# Patient Record
Sex: Male | Born: 1970 | Race: White | Hispanic: No | Marital: Married | Smoking: Never smoker
Health system: Southern US, Community
[De-identification: ages and names within clinical notes are randomized; demographics above are authoritative.]

## PROBLEM LIST (undated history)

## (undated) DIAGNOSIS — K219 Gastro-esophageal reflux disease without esophagitis: Secondary | ICD-10-CM

---

## 1998-07-31 ENCOUNTER — Emergency Department (HOSPITAL_COMMUNITY): Admission: EM | Admit: 1998-07-31 | Discharge: 1998-08-01 | Payer: Self-pay | Admitting: Emergency Medicine

## 2008-12-18 ENCOUNTER — Ambulatory Visit: Payer: Self-pay | Admitting: Sports Medicine

## 2008-12-18 DIAGNOSIS — M629 Disorder of muscle, unspecified: Secondary | ICD-10-CM

## 2008-12-18 DIAGNOSIS — M2140 Flat foot [pes planus] (acquired), unspecified foot: Secondary | ICD-10-CM | POA: Insufficient documentation

## 2008-12-26 ENCOUNTER — Encounter: Payer: Self-pay | Admitting: Sports Medicine

## 2013-12-08 ENCOUNTER — Emergency Department (HOSPITAL_BASED_OUTPATIENT_CLINIC_OR_DEPARTMENT_OTHER)
Admission: EM | Admit: 2013-12-08 | Discharge: 2013-12-08 | Disposition: A | Discharge status: Home / Self Care | Payer: BC Managed Care – PPO | Attending: Emergency Medicine | Admitting: Emergency Medicine

## 2013-12-08 ENCOUNTER — Emergency Department (HOSPITAL_BASED_OUTPATIENT_CLINIC_OR_DEPARTMENT_OTHER): Payer: BC Managed Care – PPO

## 2013-12-08 ENCOUNTER — Encounter (HOSPITAL_BASED_OUTPATIENT_CLINIC_OR_DEPARTMENT_OTHER): Payer: Self-pay | Admitting: Emergency Medicine

## 2013-12-08 DIAGNOSIS — N200 Calculus of kidney: Secondary | ICD-10-CM

## 2013-12-08 DIAGNOSIS — R109 Unspecified abdominal pain: Secondary | ICD-10-CM

## 2013-12-08 LAB — BASIC METABOLIC PANEL
BUN: 9 mg/dL (ref 6–23)
CO2: 24 mEq/L (ref 19–32)
Calcium: 9.7 mg/dL (ref 8.4–10.5)
GFR calc non Af Amer: >90 mL/min (ref >90)
Glucose, Bld: 116 mg/dL — ABNORMAL HIGH (ref 70–99)
Sodium: 139 mEq/L (ref 135–145)

## 2013-12-08 LAB — URINE MICROSCOPIC-ADD ON

## 2013-12-08 LAB — URINALYSIS, ROUTINE W REFLEX MICROSCOPIC
Glucose, UA: NEGATIVE mg/dL
Ketones, ur: 15 mg/dL — AB
Nitrite: NEGATIVE
Protein, ur: 30 mg/dL — AB
Specific Gravity, Urine: 1.027 (ref 1.005–1.030)

## 2013-12-08 MED ORDER — HYDROCODONE-ACETAMINOPHEN 5-325 MG PO TABS: 2.0000 | ORAL_TABLET | ORAL | Status: DC | PRN

## 2013-12-08 MED ORDER — ONDANSETRON HCL 4 MG/2ML IJ SOLN
4.0000 mg | Freq: Once | INTRAMUSCULAR | Status: AC
  Administered 2013-12-08: 4 mg via INTRAVENOUS
  Filled 2013-12-08: qty 2

## 2013-12-08 MED ORDER — KETOROLAC TROMETHAMINE 30 MG/ML IJ SOLN
30.0000 mg | Freq: Once | INTRAMUSCULAR | Status: AC
  Administered 2013-12-08: 30 mg via INTRAVENOUS
  Filled 2013-12-08: qty 1

## 2013-12-08 MED ORDER — SODIUM CHLORIDE 0.9 % IV BOLUS (SEPSIS)
500.0000 mL | Freq: Once | INTRAVENOUS | Status: AC
  Administered 2013-12-08: 500 mL via INTRAVENOUS

## 2013-12-08 MED ORDER — TAMSULOSIN HCL 0.4 MG PO CAPS: 0.4000 mg | ORAL_CAPSULE | Freq: Every day | ORAL | Status: DC

## 2013-12-08 MED ORDER — CIPROFLOXACIN HCL 500 MG PO TABS: 500.0000 mg | ORAL_TABLET | Freq: Two times a day (BID) | ORAL | Status: DC

## 2013-12-08 MED ORDER — CEPHALEXIN 500 MG PO CAPS: 500.0000 mg | ORAL_CAPSULE | Freq: Two times a day (BID) | ORAL | Status: DC

## 2013-12-08 MED ORDER — HYDROMORPHONE HCL PF 1 MG/ML IJ SOLN: 0.5000 mg | Freq: Once | INTRAMUSCULAR | Status: DC | PRN

## 2013-12-08 MED ORDER — HYDROMORPHONE HCL PF 1 MG/ML IJ SOLN
0.5000 mg | Freq: Once | INTRAMUSCULAR | Status: AC
  Administered 2013-12-08: 0.5 mg via INTRAVENOUS
  Filled 2013-12-08: qty 1

## 2013-12-08 NOTE — ED Notes (Signed)
Pt did not require any additional pain medication

## 2013-12-08 NOTE — ED Provider Notes (Signed)
CSN: [id number]     Arrival date & time 12/08/13  0732 History   First MD Initiated Contact with Patient 12/08/13 [phone]     Chief Complaint  Patient presents with  . Flank Pain   (Consider location/radiation/quality/duration/timing/severity/associated sxs/prior Treatment) HPI Comments: 42 yo male with no medical hx, non smoker presents with intermittent sharp/ ache left flank pain with radiation to groin since Monday, no kidney stone hx but FH of stones, no abdo surgeries, no documented fevers.  Mild nausea/ vomiting once.  No diverticular hx.  Nothing improves. No meds today. PNC allergy.  Patient is a 42 y.o. male presenting with flank pain. The history is provided by the patient.  Flank Pain This is a new problem. Pertinent negatives include no chest pain, no abdominal pain, no headaches and no shortness of breath.    History reviewed. No pertinent past medical history. History reviewed. No pertinent past surgical history. History reviewed. No pertinent family history. History  Substance Use Topics  . Smoking status: Never Smoker   . Smokeless tobacco: Not on file  . Alcohol Use: Yes     Comment: occ    Review of Systems  Constitutional: Negative for fever and chills.  HENT: Negative for congestion.   Eyes: Negative for visual disturbance.  Respiratory: Negative for shortness of breath.   Cardiovascular: Negative for chest pain.  Gastrointestinal: Negative for vomiting and abdominal pain.  Genitourinary: Positive for dysuria and flank pain.  Musculoskeletal: Positive for back pain. Negative for neck pain and neck stiffness.  Skin: Negative for rash.  Neurological: Negative for light-headedness and headaches.    Allergies  Review of patient's allergies indicates no known allergies.  Home Medications  No current outpatient prescriptions on file. BP 157/94  Pulse 74  Temp(Src) 97.8 F (36.6 C) (Oral)  Resp 20  SpO2 100% Physical Exam  Nursing note and vitals  reviewed. Constitutional: He is oriented to person, place, and time. He appears well-developed and well-nourished.  HENT:  Head: Normocephalic and atraumatic.  Eyes: Conjunctivae are normal. Right eye exhibits no discharge. Left eye exhibits no discharge.  Neck: Normal range of motion. Neck supple. No tracheal deviation present.  Cardiovascular: Normal rate and regular rhythm.   Pulmonary/Chest: Effort normal and breath sounds normal.  Abdominal: Soft. He exhibits no distension. There is tenderness (mild left lower flank). There is no guarding.  Musculoskeletal: He exhibits no edema.  Neurological: He is alert and oriented to person, place, and time.  Skin: Skin is warm. No rash noted.  Psychiatric: He has a normal mood and affect.    ED Course  Procedures (including critical care time) Emergency Focused Ultrasound Exam Limited retroperitoneal ultrasound of kidneys  Performed and interpreted by Dr. Jodi Mourning Indication: flank pain Focused abdominal ultrasound with both kidneys imaged in transverse and longitudinal planes in real-time. Interpretation: no/ minimal hydronephrosis visualized on left.  Right kidney no acute findings. No stones or cysts visualized  Images archived electronically  Labs Review Labs Reviewed  URINALYSIS, ROUTINE W REFLEX MICROSCOPIC - Abnormal; Notable for the following:    Color, Urine AMBER (*)    APPearance TURBID (*)    Hgb urine dipstick LARGE (*)    Bilirubin Urine SMALL (*)    Ketones, ur 15 (*)    Protein, ur 30 (*)    Leukocytes, UA TRACE (*)    All other components within normal limits  BASIC METABOLIC PANEL - Abnormal; Notable for the following:    Potassium 3.4 (*)  Glucose, Bld 116 (*)    All other components within normal limits  URINE MICROSCOPIC-ADD ON - Abnormal; Notable for the following:    Bacteria, UA MANY (*)    All other components within normal limits   Imaging Review Dg Abd 1 View  12/08/2013   CLINICAL DATA:  Left flank  pain.  EXAM: ABDOMEN - 1 VIEW  COMPARISON:  None.  FINDINGS: The bowel gas pattern is nonspecific. There is a loops of mildly distended gas-filled small bowel in the left mid upper abdomen. There are less distended small bowel loops noted more inferiorly, bilaterally. The stool and gas pattern in the colon is normal. There is stool and gas in the rectum. The bony structures appear normal. No abnormal calcifications project over either kidney. There is a 2 mm diameter calcification to the left of the coccyx which likely reflects a phlebolith though a tiny distal ureteral stone cannot be absolutely excluded.  IMPRESSION: 1. The bowel gas pattern is nonspecific. Minimal small bowel distention with gas is noted. No obstructive signs are demonstrated. 2. A 2 mm diameter distal left ureteral stone may be present but the findings could reflect a phlebolith as well.   Electronically Signed   By: David  [place]   On: 12/08/2013 08:32    EKG Interpretation   None       MDM   1. Kidney stone on left side   2. Left flank pain    Clinically kidney stone. Nauseated and unable to give urine. Fluids IV and po, pain and nausea meds. [place] no significant hydronephrosis. KUB.   Pt has urologist for fup (had vasectomy). Pt improved in ED, vomiting and pain controlled, 0-2 WBC but many bacteria, PO abx and fup with urology.  Results and differential diagnosis were discussed with the patient. Close follow up outpatient was discussed, patient comfortable with the plan.   Diagnosis:above    Enid Skeens, MD 12/08/13 1041

## 2013-12-08 NOTE — ED Notes (Signed)
Urinal at bedside encouraged pt to attempt to void

## 2013-12-08 NOTE — ED Notes (Signed)
Left flank pain onset Monday has been off and on since then but last pm started getting worse now radiating around to lower abdomen on left side. Reports urinary frequency.

## 2013-12-09 LAB — URINE CULTURE: Colony Count: NO GROWTH

## 2014-12-14 IMAGING — CR DG ABDOMEN 1V
1 series · 1 of 1 positions shown · non-contrast
Comparison: None.

CLINICAL DATA: Left flank pain.

EXAM:
ABDOMEN - 1 VIEW

[t abdomen supine]
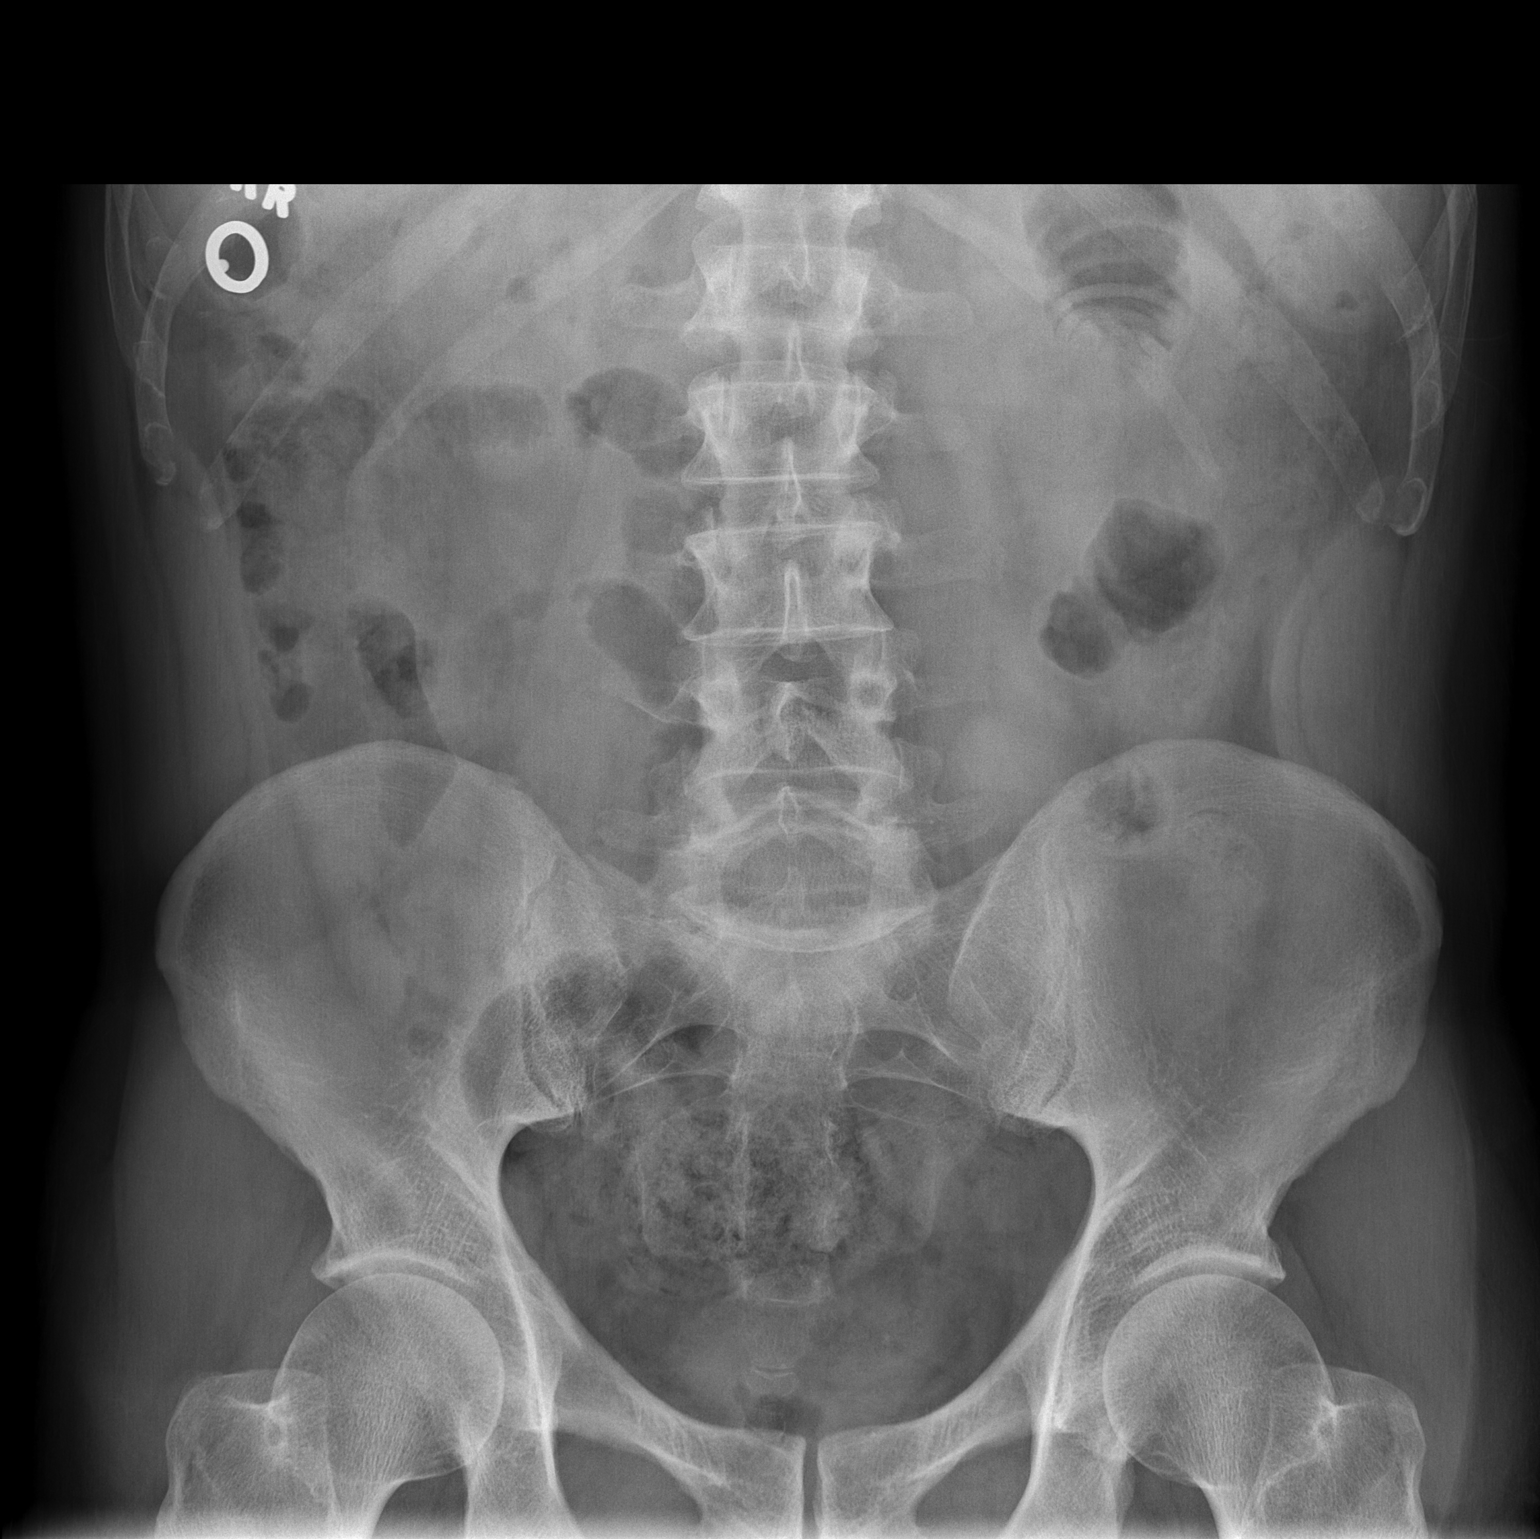

[1 of 1 positions shown; findings below may reference images not displayed]

FINDINGS: The bowel gas pattern is nonspecific. There is a loops of mildly
distended gas-filled small bowel in the left mid upper abdomen.
There are less distended small bowel loops noted more inferiorly,
bilaterally. The stool and gas pattern in the colon is normal. There
is stool and gas in the rectum. The bony structures appear normal.
No abnormal calcifications project over either kidney. There is a 2
mm diameter calcification to the left of the coccyx which likely
reflects a phlebolith though a tiny distal ureteral stone cannot be
absolutely excluded.
IMPRESSION: 1. The bowel gas pattern is nonspecific. Minimal small bowel
distention with gas is noted. No obstructive signs are demonstrated.
2. A 2 mm diameter distal left ureteral stone may be present but the
findings could reflect a phlebolith as well.

## 2016-05-30 DIAGNOSIS — Z79891 Long term (current) use of opiate analgesic: Secondary | ICD-10-CM | POA: Insufficient documentation

## 2016-05-30 DIAGNOSIS — Z79899 Other long term (current) drug therapy: Secondary | ICD-10-CM | POA: Diagnosis not present

## 2016-05-30 DIAGNOSIS — K222 Esophageal obstruction: Secondary | ICD-10-CM | POA: Insufficient documentation

## 2016-05-30 DIAGNOSIS — R12 Heartburn: Secondary | ICD-10-CM | POA: Diagnosis present

## 2016-05-30 DIAGNOSIS — K449 Diaphragmatic hernia without obstruction or gangrene: Secondary | ICD-10-CM | POA: Diagnosis not present

## 2016-05-30 DIAGNOSIS — T18128A Food in esophagus causing other injury, initial encounter: Secondary | ICD-10-CM | POA: Insufficient documentation

## 2016-05-30 DIAGNOSIS — Z7951 Long term (current) use of inhaled steroids: Secondary | ICD-10-CM | POA: Diagnosis not present

## 2016-05-30 DIAGNOSIS — X58XXXA Exposure to other specified factors, initial encounter: Secondary | ICD-10-CM | POA: Insufficient documentation

## 2016-05-30 DIAGNOSIS — K219 Gastro-esophageal reflux disease without esophagitis: Secondary | ICD-10-CM | POA: Diagnosis not present

## 2016-05-31 ENCOUNTER — Ambulatory Visit (HOSPITAL_BASED_OUTPATIENT_CLINIC_OR_DEPARTMENT_OTHER)
Admission: EM | Admit: 2016-05-31 | Discharge: 2016-05-31 | Disposition: A | Discharge status: Home / Self Care | Payer: PRIVATE HEALTH INSURANCE | Attending: Gastroenterology | Admitting: Gastroenterology

## 2016-05-31 ENCOUNTER — Encounter (HOSPITAL_COMMUNITY)
Admission: EM | Disposition: A | Discharge status: Home / Self Care | Payer: Self-pay | Source: Home / Self Care | Attending: Emergency Medicine

## 2016-05-31 ENCOUNTER — Encounter (HOSPITAL_BASED_OUTPATIENT_CLINIC_OR_DEPARTMENT_OTHER): Payer: Self-pay | Admitting: *Deleted

## 2016-05-31 DIAGNOSIS — T18128A Food in esophagus causing other injury, initial encounter: Secondary | ICD-10-CM

## 2016-05-31 HISTORY — DX: Gastro-esophageal reflux disease without esophagitis: K21.9

## 2016-05-31 HISTORY — PX: ESOPHAGOGASTRODUODENOSCOPY: SHX5428

## 2016-05-31 LAB — CBC WITH DIFFERENTIAL/PLATELET
BASOS ABS: 0 10*3/uL (ref 0.0–0.1)
Basophils Relative: 0 %
Eosinophils Absolute: 0 10*3/uL (ref 0.0–0.7)
Eosinophils Relative: 0 %
HEMATOCRIT: 41.3 % (ref 39.0–52.0)
HEMOGLOBIN: 15.3 g/dL (ref 13.0–17.0)
LYMPHS PCT: 21 %
Lymphs Abs: 1.9 10*3/uL (ref 0.7–4.0)
MCH: 31.2 pg (ref 26.0–34.0)
MCHC: 37 g/dL — ABNORMAL HIGH (ref 30.0–36.0)
MCV: 84.3 fL (ref 78.0–100.0)
MONOS PCT: 8 %
Monocytes Absolute: 0.7 10*3/uL (ref 0.1–1.0)
Neutro Abs: 6.4 10*3/uL (ref 1.7–7.7)
Neutrophils Relative %: 71 %
Platelets: 238 10*3/uL (ref 150–400)
RBC: 4.9 MIL/uL (ref 4.22–5.81)
RDW: 13 % (ref 11.5–15.5)
WBC: 9 10*3/uL (ref 4.0–10.5)

## 2016-05-31 LAB — COMPREHENSIVE METABOLIC PANEL
ALBUMIN: 4.7 g/dL (ref 3.5–5.0)
ALK PHOS: 73 U/L (ref 38–126)
ALT: 27 U/L (ref 17–63)
AST: 20 U/L (ref 15–41)
Anion gap: 8 (ref 5–15)
BILIRUBIN TOTAL: 0.8 mg/dL (ref 0.3–1.2)
BUN: 14 mg/dL (ref 6–20)
CALCIUM: 9.5 mg/dL (ref 8.9–10.3)
CO2: 23 mmol/L (ref 22–32)
CREATININE: 0.91 mg/dL (ref 0.61–1.24)
Chloride: 108 mmol/L (ref 101–111)
GFR calc Af Amer: >60 mL/min (ref >60)
GFR calc non Af Amer: >60 mL/min (ref >60)
GLUCOSE: 110 mg/dL — AB (ref 65–99)
Potassium: 3.6 mmol/L (ref 3.5–5.1)
Sodium: 139 mmol/L (ref 135–145)
TOTAL PROTEIN: 7.5 g/dL (ref 6.5–8.1)

## 2016-05-31 LAB — LIPASE, BLOOD: Lipase: 17 U/L (ref 11–51)

## 2016-05-31 SURGERY — EGD (ESOPHAGOGASTRODUODENOSCOPY)
Anesthesia: Moderate Sedation

## 2016-05-31 MED ORDER — MIDAZOLAM HCL 10 MG/2ML IJ SOLN
INTRAMUSCULAR | Status: DC | PRN
  Administered 2016-05-31: 1 mg via INTRAVENOUS
  Administered 2016-05-31 (×2): 2 mg via INTRAVENOUS

## 2016-05-31 MED ORDER — SODIUM CHLORIDE 0.9 % IV BOLUS (SEPSIS)
1000.0000 mL | Freq: Once | INTRAVENOUS | Status: AC
  Administered 2016-05-31: 1000 mL via INTRAVENOUS

## 2016-05-31 MED ORDER — MIDAZOLAM HCL 5 MG/ML IJ SOLN
INTRAMUSCULAR | Status: AC
  Filled 2016-05-31: qty 2

## 2016-05-31 MED ORDER — BUTAMBEN-TETRACAINE-BENZOCAINE 2-2-14 % EX AERO
INHALATION_SPRAY | CUTANEOUS | Status: DC | PRN
Start: 2016-05-31 — End: 2016-05-31
  Administered 2016-05-31: 2 via TOPICAL

## 2016-05-31 MED ORDER — METOCLOPRAMIDE HCL 10 MG PO TABS
10.0000 mg | ORAL_TABLET | Freq: Once | ORAL | Status: DC
  Filled 2016-05-31: qty 1

## 2016-05-31 MED ORDER — DIPHENHYDRAMINE HCL 50 MG/ML IJ SOLN
INTRAMUSCULAR | Status: AC
  Filled 2016-05-31: qty 1

## 2016-05-31 MED ORDER — FAMOTIDINE IN NACL 20-0.9 MG/50ML-% IV SOLN
20.0000 mg | Freq: Once | INTRAVENOUS | Status: AC
  Administered 2016-05-31: 20 mg via INTRAVENOUS
  Filled 2016-05-31: qty 50

## 2016-05-31 MED ORDER — PROMETHAZINE HCL 25 MG/ML IJ SOLN
25.0000 mg | Freq: Once | INTRAMUSCULAR | Status: AC
  Administered 2016-05-31: 25 mg via INTRAVENOUS
  Filled 2016-05-31: qty 1

## 2016-05-31 MED ORDER — RANITIDINE HCL 150 MG/10ML PO SYRP
300.0000 mg | ORAL_SOLUTION | Freq: Once | ORAL | Status: DC
  Filled 2016-05-31: qty 20

## 2016-05-31 MED ORDER — LORAZEPAM 2 MG/ML IJ SOLN
0.5000 mg | Freq: Once | INTRAMUSCULAR | Status: AC
  Administered 2016-05-31: 0.5 mg via INTRAVENOUS
  Filled 2016-05-31: qty 1

## 2016-05-31 MED ORDER — SODIUM CHLORIDE 0.9 % IV SOLN
INTRAVENOUS | Status: DC
  Administered 2016-05-31: 500 mL via INTRAVENOUS

## 2016-05-31 MED ORDER — FENTANYL CITRATE (PF) 100 MCG/2ML IJ SOLN
INTRAMUSCULAR | Status: DC | PRN
  Administered 2016-05-31 (×2): 25 ug via INTRAVENOUS

## 2016-05-31 MED ORDER — METOCLOPRAMIDE HCL 5 MG/ML IJ SOLN
10.0000 mg | Freq: Once | INTRAMUSCULAR | Status: AC
  Administered 2016-05-31: 10 mg via INTRAVENOUS
  Filled 2016-05-31: qty 2

## 2016-05-31 MED ORDER — SUCRALFATE 1 G PO TABS
1.0000 g | ORAL_TABLET | Freq: Once | ORAL | Status: DC
  Filled 2016-05-31 (×2): qty 1

## 2016-05-31 MED ORDER — FENTANYL CITRATE (PF) 100 MCG/2ML IJ SOLN
INTRAMUSCULAR | Status: AC
  Filled 2016-05-31: qty 4

## 2016-05-31 MED ORDER — ONDANSETRON 4 MG PO TBDP
4.0000 mg | ORAL_TABLET | Freq: Once | ORAL | Status: AC
  Administered 2016-05-31: 4 mg via ORAL
  Filled 2016-05-31: qty 1

## 2016-05-31 NOTE — ED Notes (Signed)
Pt pacing around the room appearing very anxious. Pt states he suddenly feels increased severity in the burning in his chest. Pt vomited x1.

## 2016-05-31 NOTE — Op Note (Signed)
[facility] Patient Name: Christopher Buck Procedure Date: 05/31/2016 MRN: [id number] Attending MD: Willis Modena , MD Date of Birth: 1971/07/24 CSN: [id number] Age: 45 Admit Type: Outpatient Procedure:                Upper GI endoscopy Indications:              Suspected gastro-esophageal reflux disease, Failure                            to respond to medical treatment, Foreign body in                            the esophagus Providers:                Willis Modena, MD, Dwain Sarna, RN, Jacquiline Doe, RN, Oletha Blend, Technician Referring MD:             Tomasita Crumble, MD Medicines:                Fentanyl 50 micrograms IV, Midazolam 5 mg IV,                            Cetacaine spray Complications:            No immediate complications. Estimated Blood Loss:     Estimated blood loss: none. Procedure:                Pre-Anesthesia Assessment:                           - Prior to the procedure, a History and Physical                            was performed, and patient medications and                            allergies were reviewed. The patient's tolerance of                            previous anesthesia was also reviewed. The risks                            and benefits of the procedure and the sedation                            options and risks were discussed with the patient.                            All questions were answered, and informed consent                            was obtained. Prior Anticoagulants: The patient has  taken no previous anticoagulant or antiplatelet                            agents. ASA Grade Assessment: I - A normal, healthy                            patient. After reviewing the risks and benefits,                            the patient was deemed in satisfactory condition to                            undergo the procedure.                           After obtaining  informed consent, the endoscope was                            passed under direct vision. Throughout the                            procedure, the patient's blood pressure, pulse, and                            oxygen saturations were monitored continuously. The                            [id number] ([id number]) scope was introduced through the                            mouth, and advanced to the second part of duodenum.                            The upper GI endoscopy was accomplished without                            difficulty. The patient tolerated the procedure                            well. Scope In: Scope Out: Findings:      A small hiatal hernia was present.      Food was found in the distal esophagus. After gentle manipulation with       the endoscope, food bolus was nudged into the stomach.      One moderate benign-appearing, intrinsic stenosis was found. Consistent       with Schatzki's ring. Estimated luminal diameter about 13mm.      The exam of the esophagus was otherwise normal.      The entire examined stomach was normal.      The duodenal bulb, first portion of the duodenum and second portion of       the duodenum were normal. Impression:               - Small hiatal hernia.                           -  Food in the distal esophagus. Removal was                            successful.                           - Schatzki's ring, likely underlying cause of food                            impaction.                           - Normal stomach.                           - Normal duodenal bulb, first portion of the                            duodenum and second portion of the duodenum. Moderate Sedation:      Moderate (conscious) sedation was administered by the endoscopy nurse       and supervised by the endoscopist. The following parameters were       monitored: oxygen saturation, heart rate, blood pressure, and response       to care. Recommendation:           -  Discharge patient to home (with spouse).                           - Mechanical soft diet until endoscopic esophageal                            dilatation is performed (see below).                           - Use Prilosec OTC 20 mg PO daily until further                            notice.                           - Return to GI clinic in 2-3 weeks, for repeat                            endoscopy and esophageal dilatation.                           - Case discussed with ED team; it's ok for patient                            to be discharged home after he wakes up from his                            sedation.                           - [facility] will sign-off; please call with any  questions; thank you for the consultation.                           - Continue present medications. Procedure Code(s):        --- Professional ---                           [id number], Esophagogastroduodenoscopy, flexible,                            transoral; with removal of foreign body(s) Diagnosis Code(s):        --- Professional ---                           K44.9, Diaphragmatic hernia without obstruction or                            gangrene                           T18.128A, Food in esophagus causing other injury,                            initial encounter                           K22.2, Esophageal obstruction                           T18.108A, Unspecified foreign body in esophagus                            causing other injury, initial encounter CPT copyright 2016 [employer]. All rights reserved. The codes documented in this report are preliminary and upon coder review may  be revised to meet current compliance requirements. Willis ModenaWilliam Nilson Tabora, MD 05/31/2016 8:34:47 AM This report has been signed electronically. Number of Addenda: 0

## 2016-05-31 NOTE — ED Notes (Signed)
Bed: RESA Expected date:  Expected time:  Means of arrival:  Comments: Endoscopy pt

## 2016-05-31 NOTE — ED Provider Notes (Signed)
CSN: [id number]     Arrival date & time 05/30/16  2359 History   First MD Initiated Contact with Patient 05/31/16 0139     Chief Complaint  Patient presents with  . Heartburn     (Consider location/radiation/quality/duration/timing/severity/associated sxs/prior Treatment) HPI   Christopher Buck is a 45 y.o. male, with a history of GERD, presenting to the ED with a feeling of something lodged in his esophagus. Pt was eating steak when he began to have the feeling something was stuck. He then had burning in his throat, "like acid reflux." From there, patient states that anything he would eat or drink immediately comes back up. Pt denies difficulty breathing, recent illness, hematemesis, chest pain, or any other complaints.    Patient received as a transfer from [facility] ED. Cared for by Dr. Mora Bellman. See his history of present illness below. Patient to be evaluated by [facility]. Dr. Junious Silk HPI: "Christopher Buck is a 45 y.o. male with past medical history of reflux presenting today with worsening of his reflux. He states he had some wobbliness take today, he had sudden onset of burning feeling in his throat. Patient states he feels like something is stuck near his stomach. He has been vomiting everything is turned to keep down. This includes his home and acids, water, and Zofran given in triage. Patient continues to be symptomatic. He denies any heart history. He denies any shortness of breath or diaphoresis associated with this. There are no further complaints. His symptoms are better when he stands and worse when he lays flat."  Past Medical History  Diagnosis Date  . GERD (gastroesophageal reflux disease)    History reviewed. No pertinent past surgical history. History reviewed. No pertinent family history. Social History  Substance Use Topics  . Smoking status: Never Smoker   . Smokeless tobacco: None  . Alcohol Use: Yes     Comment: occ    Review of Systems  Constitutional:  Negative for fever and chills.  Respiratory: Negative for cough and shortness of breath.   Gastrointestinal: Negative for nausea and abdominal pain.       Feeling of something stuck in the esophagus. Regurgitation of all oral intake.  All other systems reviewed and are negative.     Allergies  Review of patient's allergies indicates no known allergies.  Home Medications   Prior to Admission medications   Medication Sig Start Date End Date Taking? Authorizing Provider  fluticasone (FLONASE) 50 MCG/ACT nasal spray Place 1 spray into both nostrils daily as needed. For pollen exposure. 03/10/16  Yes Historical Provider, MD  omeprazole (PRILOSEC) 40 MG capsule Take 40 mg by mouth daily.   Yes Historical Provider, MD   BP 125/81 mmHg  Pulse 79  Temp(Src) 98 F (36.7 C)  Resp 18  Ht 6' (1.829 m)  Wt 87.091 kg  BMI 26.03 kg/m2  SpO2 98% Physical Exam  Constitutional: He appears well-developed and well-nourished. No distress.  HENT:  Head: Normocephalic and atraumatic.  Mouth/Throat: Oropharynx is clear and moist.  Swallow intact.  Eyes: Conjunctivae are normal.  Neck: Normal range of motion. Neck supple. No tracheal deviation present.  Cardiovascular: Normal rate, regular rhythm, normal heart sounds and intact distal pulses.   Pulmonary/Chest: Effort normal and breath sounds normal. No stridor. No respiratory distress.  Abdominal: Soft. There is no tenderness. There is no guarding.  Musculoskeletal: He exhibits no edema or tenderness.  Lymphadenopathy:    He has no cervical adenopathy.  Neurological: He is alert.  Skin: Skin is warm and dry. He is not diaphoretic.  Psychiatric: He has a normal mood and affect. His behavior is normal.  Nursing note and vitals reviewed.   ED Course  Procedures (including critical care time) Labs Review Labs Reviewed  CBC WITH DIFFERENTIAL/PLATELET - Abnormal; Notable for the following:    MCHC 37.0 (*)    All other components within normal  limits  COMPREHENSIVE METABOLIC PANEL - Abnormal; Notable for the following:    Glucose, Bld 110 (*)    All other components within normal limits  LIPASE, BLOOD    Imaging Review No results found. I have personally reviewed and evaluated these images and lab results as part of my medical decision-making.   EKG Interpretation   Date/Time:  Saturday May 31 2016 01:52:51 EDT Ventricular Rate:  77 PR Interval:  165 QRS Duration: 89 QT Interval:  392 QTC Calculation: 444 R Axis:   53 Text Interpretation:  Sinus rhythm No old tracing to compare Confirmed by  Erroll Lunani, Adeleke Ayokunle [phone]) on 05/31/2016 2:23:53 AM       Medications  ondansetron (ZOFRAN-ODT) disintegrating tablet 4 mg (4 mg Oral Given 05/31/16 0129)  sodium chloride 0.9 % bolus 1,000 mL (0 mLs Intravenous Stopped 05/31/16 0351)  metoCLOPramide (REGLAN) injection 10 mg (10 mg Intravenous Given 05/31/16 0211)  famotidine (PEPCID) IVPB 20 mg premix (0 mg Intravenous Stopped 05/31/16 0239)  promethazine (PHENERGAN) injection 25 mg (25 mg Intravenous Given 05/31/16 0308)  LORazepam (ATIVAN) injection 0.5 mg (0.5 mg Intravenous Given 05/31/16 0308)    MDM   Final diagnoses:  Food impaction of esophagus, initial encounter    Christopher Buck presents with a feeling of something stuck in his esophagus and Regurgitation of all oral intake since last night.  Findings and plan of care discussed with Christopher Boozeavid Glick, MD, who [facility]  Pt to be evaluated by [facility] later this morning. [facility] Scheduled for an endoscopy at 0730.  Spoke with Dr. Dulce Sellarutlaw following the procedure. Advised that the patient will need to wake up a bit, but then can be discharged. His office will call the patient to set up outpatient follow up. Pt reassessed and ambulated without difficulty. Pt accompanied by his wife at discharge. Return precautions discussed. Patient and patient's  wife voiced understanding of these instructions and are comfortable with discharge.  Filed Vitals:   05/31/16 0009 05/31/16 0230 05/31/16 0330 05/31/16 0603  BP: 135/74 136/98 125/81 131/89  Pulse: 80 79 79 63  Temp: 98 F (36.7 C)     Resp: 16 18    Height: 6' (1.829 m)     Weight: 87.091 kg     SpO2: 96% 98% 98% 96%   Filed Vitals:   05/31/16 0915 05/31/16 0930 05/31/16 0945 05/31/16 0950  BP: 113/71 122/75 109/68   Pulse: 68 69 65 69  Temp:      TempSrc:      Resp: 20 22 20 19   Height:      Weight:      SpO2: 96% 97% 98% 98%     Anselm PancoastShawn C Kiaan Overholser, PA-C 05/31/16 1552  Christopher Boozeavid Glick, MD 06/01/16 [phone]

## 2016-05-31 NOTE — ED Provider Notes (Signed)
CSN: [id number]     Arrival date & time 05/30/16  2359 History   First MD Initiated Contact with Patient 05/31/16 0139     Chief Complaint  Patient presents with  . Heartburn     (Consider location/radiation/quality/duration/timing/severity/associated sxs/prior Treatment) HPI   Alvino ChapelCharles D Myron is a 45 y.o. male with past medical history of reflux presenting today with worsening of his reflux. He states he had some wobbliness take today, he had sudden onset of burning feeling in his throat. Patient states he feels like something is stuck near his stomach. He has been vomiting everything is turned to keep down. This includes his home and acids, water, and Zofran given in triage. Patient continues to be symptomatic. He denies any heart history. He denies any shortness of breath or diaphoresis associated with this. There are no further complaints. His symptoms are better when he stands and worse when he lays flat.  10 Systems reviewed and are negative for acute change except as noted in the HPI.     Past Medical History  Diagnosis Date  . GERD (gastroesophageal reflux disease)    History reviewed. No pertinent past surgical history. History reviewed. No pertinent family history. Social History  Substance Use Topics  . Smoking status: Never Smoker   . Smokeless tobacco: None  . Alcohol Use: Yes     Comment: occ    Review of Systems    Allergies  Review of patient's allergies indicates no known allergies.  Home Medications   Prior to Admission medications   Medication Sig Start Date End Date Taking? Authorizing Provider  omeprazole (PRILOSEC) 40 MG capsule Take 40 mg by mouth daily.   Yes Historical Provider, MD  ciprofloxacin (CIPRO) 500 MG tablet Take 1 tablet (500 mg total) by mouth every 12 (twelve) hours. 12/08/13   Blane OharaJoshua Zavitz, MD  HYDROcodone-acetaminophen (NORCO/VICODIN) 5-325 MG per tablet Take 2 tablets by mouth every 4 (four) hours as needed. 12/08/13   Blane OharaJoshua  Zavitz, MD  tamsulosin (FLOMAX) 0.4 MG CAPS capsule Take 1 capsule (0.4 mg total) by mouth daily. 12/08/13   Blane OharaJoshua Zavitz, MD   BP 135/74 mmHg  Pulse 80  Temp(Src) 98 F (36.7 C)  Resp 16  Ht 6' (1.829 m)  Wt 192 lb (87.091 kg)  BMI 26.03 kg/m2  SpO2 96% Physical Exam  Constitutional: He is oriented to person, place, and time. Vital signs are normal. He appears well-developed and well-nourished.  Non-toxic appearance. He does not appear ill. No distress.  HENT:  Head: Normocephalic and atraumatic.  Nose: Nose normal.  Mouth/Throat: Oropharynx is clear and moist. No oropharyngeal exudate.  Eyes: Conjunctivae and EOM are normal. Pupils are equal, round, and reactive to light. No scleral icterus.  Neck: Normal range of motion. Neck supple. No tracheal deviation, no edema, no erythema and normal range of motion present. No thyroid mass and no thyromegaly present.  Cardiovascular: Normal rate, regular rhythm, S1 normal, S2 normal, normal heart sounds, intact distal pulses and normal pulses.  Exam reveals no gallop and no friction rub.   No murmur heard. Pulmonary/Chest: Effort normal and breath sounds normal. No respiratory distress. He has no wheezes. He has no rhonchi. He has no rales.  Abdominal: Soft. Normal appearance and bowel sounds are normal. He exhibits no distension, no ascites and no mass. There is no hepatosplenomegaly. There is no tenderness. There is no rebound, no guarding and no CVA tenderness.  Musculoskeletal: Normal range of motion. He exhibits no edema or  tenderness.  Lymphadenopathy:    He has no cervical adenopathy.  Neurological: He is alert and oriented to person, place, and time. He has normal strength. No cranial nerve deficit or sensory deficit.  Skin: Skin is warm, dry and intact. No petechiae and no rash noted. He is not diaphoretic. No erythema. No pallor.  Psychiatric: He has a normal mood and affect. His behavior is normal. Judgment normal.  Nursing note and  vitals reviewed.   ED Course  Procedures (including critical care time) Labs Review Labs Reviewed  CBC WITH DIFFERENTIAL/PLATELET - Abnormal; Notable for the following:    MCHC 37.0 (*)    All other components within normal limits  COMPREHENSIVE METABOLIC PANEL - Abnormal; Notable for the following:    Glucose, Bld 110 (*)    All other components within normal limits  LIPASE, BLOOD    Imaging Review No results found. I have personally reviewed and evaluated these images and lab results as part of my medical decision-making.   EKG Interpretation   Date/Time:  Saturday May 31 2016 01:52:51 EDT Ventricular Rate:  77 PR Interval:  165 QRS Duration: 89 QT Interval:  392 QTC Calculation: 444 R Axis:   53 Text Interpretation:  Sinus rhythm No old tracing to compare Confirmed by  Erroll Luna [phone]) on 05/31/2016 2:23:53 AM      MDM   Final diagnoses:  None    Patient presents emergency department for worsening reflux. He was given IV famotidine, Reglan and IV fluids for treatment. EKG does not show any signs of ischemia. Will continue to monitor.  3:09 AM Patient continues to vomit his own Saliva. He is not maintaining his own secretions. I concern for a food bolus impaction. I spoke with Dr. Dulce Sellar who will see the patient at [facility]. Although I believe this patient needs an emergent evaluation he states the patient can wait until 7 AM. Patient will be transferred at that time for further care. He was given Ativan and Phenergan for further nausea relief.  Tomasita Crumble, MD 05/31/16 0330

## 2016-05-31 NOTE — H&P (View-Only) (Signed)
[facility] Consultation Note  Referring Provider: Dr. Tomasita CrumbleAdeleke Oni ([facility]) Primary Care Physician:  Lenora BoysFRIED, ROBERT L, MD  Reason for Consultation:  Possible esophageal food impaction  HPI: Christopher Buck is a 45 y.o. male presenting with nausea and vomiting and sialorrhea since 5 pm after eating couple bites of steak.  Has history of GERD but no dysphagia.  I was called at 3 am by [facility] for these symptoms, and [facility].  Patient has not had any vomiting or sialorrhea since about 3 am.  He has some soreness in his throat but no chest pain.  Has globus sensation.  Was able to tolerate sips of water here in the ED.   Past Medical History  Diagnosis Date  . GERD (gastroesophageal reflux disease)     History reviewed. No pertinent past surgical history.  Prior to Admission medications   Medication Sig Start Date End Date Taking? Authorizing Provider  fluticasone (FLONASE) 50 MCG/ACT nasal spray Place 1 spray into both nostrils daily as needed. For pollen exposure. 03/10/16  Yes Historical Provider, MD  omeprazole (PRILOSEC) 40 MG capsule Take 40 mg by mouth daily.   Yes Historical Provider, MD    No current facility-administered medications for this encounter.   Current Outpatient Prescriptions  Medication Sig Dispense Refill  . fluticasone (FLONASE) 50 MCG/ACT nasal spray Place 1 spray into both nostrils daily as needed. For pollen exposure.    Marland Kitchen. omeprazole (PRILOSEC) 40 MG capsule Take 40 mg by mouth daily.      Allergies as of 05/30/2016  . (No Known Allergies)    History reviewed. No pertinent family history.  Social History   Social History  . Marital Status: Married    Spouse Name: N/A  . Number of Children: N/A  . Years of Education: N/A   Occupational History  . Not on file.   Social History Main Topics  . Smoking status: Never Smoker   . Smokeless tobacco: Not on file  .  Alcohol Use: Yes     Comment: occ  . Drug Use: No  . Sexual Activity: Not on file   Other Topics Concern  . Not on file   Social History Narrative    Review of Systems: As per HPI, all others negative  Physical Exam: Vital signs in last 24 hours: Temp:  [98 F (36.7 C)] 98 F (36.7 C) (06/10 0009) Pulse Rate:  [63-80] 76 (06/10 0802) Resp:  [14-20] 14 (06/10 0802) BP: (121-136)/(74-98) 121/89 mmHg (06/10 0757) SpO2:  [96 %-99 %] 99 % (06/10 0802) Weight:  [87.091 kg (192 lb)] 87.091 kg (192 lb) (06/10 0009)   General:   Alert,  Well-developed, well-nourished, pleasant and cooperative in NAD Head:  Normocephalic and atraumatic. Eyes:  Sclera clear, no icterus.   Conjunctiva pink. Ears:  Normal auditory acuity. Nose:  No deformity, discharge,  or lesions. Mouth:  No deformity or lesions.  Oropharynx pink & moist. Neck:  Supple; no masses or thyromegaly. Lungs:  Clear throughout to auscultation.   No wheezes, crackles, or rhonchi. No acute distress. Heart:  Regular rate and rhythm; no murmurs, clicks, rubs,  or gallops. Abdomen:  Soft, nontender and nondistended. No masses, hepatosplenomegaly or hernias noted. Normal bowel sounds, without guarding, and without rebound.     Msk:  Symmetrical without gross deformities. Normal posture. Pulses:  Normal pulses noted. Extremities:  Without clubbing or edema. Neurologic:  Alert  and  oriented x4;  grossly normal neurologically. Skin:  Intact without significant lesions or rashes. Psych:  Alert and cooperative. Normal mood and affect.   Lab Results:  Recent Labs  05/31/16 0205  WBC 9.0  HGB 15.3  HCT 41.3  PLT 238   BMET  Recent Labs  05/31/16 0205  NA 139  K 3.6  CL 108  CO2 23  GLUCOSE 110*  BUN 14  CREATININE 0.91  CALCIUM 9.5   LFT  Recent Labs  05/31/16 0205  PROT 7.5  ALBUMIN 4.7  AST 20  ALT 27  ALKPHOS 73  BILITOT 0.8   PT/INR No results for input(s): LABPROT, INR in the last 72  hours.  Studies/Results: No results found.  Impression:  1.  Possible esophageal foreign body (steak). 2.  GERD.  Plan:  1.  Endoscopy with possible esophageal foreign body removal. 2.  Risks (bleeding, infection, bowel perforation that could require surgery, sedation-related changes in cardiopulmonary systems), benefits (identification and possible treatment of source of symptoms, exclusion of certain causes of symptoms), and alternatives (watchful waiting, radiographic imaging studies, empiric medical treatment) of upper endoscopy (EGD) were explained to patient/family in detail and patient wishes to proceed.     Freddy Jaksch  05/31/2016, 8:06 AM  Pager [phone] If no answer or after 5 PM call [phone]

## 2016-05-31 NOTE — ED Notes (Addendum)
Patient ambulated down the hallway without difficulty. PA made aware

## 2016-05-31 NOTE — Interval H&P Note (Signed)
History and Physical Interval Note:  05/31/2016 8:10 AM  Christopher Buck  has presented today for surgery, with the diagnosis of Foreign body removal  The various methods of treatment have been discussed with the patient and family. After consideration of risks, benefits and other options for treatment, the patient has consented to  Procedure(s): ESOPHAGOGASTRODUODENOSCOPY (EGD) (N/A) as a surgical intervention .  The patient's history has been reviewed, patient examined, no change in status, stable for surgery.  I have reviewed the patient's chart and labs.  Questions were answered to the patient's satisfaction.     Freddy JakschUTLAW,Sherilynn Dieu M

## 2016-05-31 NOTE — ED Notes (Signed)
Endoscopy staff states they will perform procedure around 0730

## 2016-05-31 NOTE — ED Notes (Signed)
Endoscopy at bedside. 

## 2016-05-31 NOTE — ED Notes (Signed)
Wife Aletha HalimKelsey Giangrande can be reached at [phone] cellphone

## 2016-05-31 NOTE — ED Notes (Signed)
Pt up pacing in the room and vomiting small amount of clear liquid

## 2016-05-31 NOTE — ED Notes (Signed)
MD at bedside. 

## 2016-05-31 NOTE — ED Notes (Signed)
Discharge instructions and follow up care reviewed with patient and patient's wife. Patient and patient's wife verbalized understanding. Patient is alert, oriented, in no acute distress, and able to ambulate/follow commands upon discharge.

## 2016-05-31 NOTE — ED Notes (Addendum)
Pt states it feels like something is stuck at his stomach. Pt stated these symptoms started suddenly when he was eating steak at dinner this evening.

## 2016-05-31 NOTE — ED Notes (Signed)
[facility] notified of pt's arrival in the ED

## 2016-05-31 NOTE — ED Notes (Signed)
Pt c/o acid reflux with burning feeling in throat x 5 hrs

## 2016-05-31 NOTE — Discharge Instructions (Signed)
Endoscopy Care After Please read the instructions outlined below and refer to this sheet in the next few weeks. These discharge instructions provide you with general information on caring for yourself after you leave the hospital. Your doctor may also give you specific instructions. While your treatment has been planned according to the most current medical practices available, unavoidable complications occasionally occur. If you have any problems or questions after discharge, please call Dr. Dulce Sellarutlaw [facility]) at [phone].  HOME CARE INSTRUCTIONS Activity  You may resume your regular activity but move at a slower pace for the next 24 hours.   Take frequent rest periods for the next 24 hours.   Walking will help expel (get rid of) the air and reduce the bloated feeling in your abdomen.   No driving for 24 hours (because of the anesthesia (medicine) used during the test).   You may shower.   Do not sign any important legal documents or operate any machinery for 24 hours (because of the anesthesia used during the test).  Nutrition  Drink plenty of fluids.   Soft, chopped diet until your esophageal dilatation is done.  Chew food slowly and carefully.  Take small bites of food.  Avoid alcoholic beverages for 24 hours or as instructed by your caregiver.  Medications You may resume your normal medications unless your caregiver tells you otherwise. What you can expect today  You may experience abdominal discomfort such as a feeling of fullness or "gas" pains.   You may experience a sore throat for 2 to 3 days. This is normal. Gargling with salt water may help this.    SEEK IMMEDIATE MEDICAL CARE IF:  You have excessive nausea (feeling sick to your stomach) and/or vomiting.   You have severe abdominal pain and distention (swelling).   You have trouble swallowing.   You have a temperature over 100 F (37.8 C).   You have rectal bleeding or vomiting of blood.    Document Released: 07/22/2004 Document Revised: 08/20/2011 Document Reviewed: 02/02/2008 [facility] Patient Information 2012 [place] [place].

## 2016-05-31 NOTE — Consult Note (Signed)
[facility] Consultation Note  Referring Provider: Dr. Tomasita CrumbleAdeleke Oni ([facility]) Primary Care Physician:  Lenora BoysFRIED, ROBERT L, MD  Reason for Consultation:  Possible esophageal food impaction  HPI: Christopher Buck is a 45 y.o. male presenting with nausea and vomiting and sialorrhea since 5 pm after eating couple bites of steak.  Has history of GERD but no dysphagia.  I was called at 3 am by [facility] for these symptoms, and arrangements made [facility].  Patient has not had any vomiting or sialorrhea since about 3 am.  He has some soreness in his throat but no chest pain.  Has globus sensation.  Was able to tolerate sips of water here in the ED.   Past Medical History  Diagnosis Date  . GERD (gastroesophageal reflux disease)     History reviewed. No pertinent past surgical history.  Prior to Admission medications   Medication Sig Start Date End Date Taking? Authorizing Provider  fluticasone (FLONASE) 50 MCG/ACT nasal spray Place 1 spray into both nostrils daily as needed. For pollen exposure. 03/10/16  Yes Historical Provider, MD  omeprazole (PRILOSEC) 40 MG capsule Take 40 mg by mouth daily.   Yes Historical Provider, MD    No current facility-administered medications for this encounter.   Current Outpatient Prescriptions  Medication Sig Dispense Refill  . fluticasone (FLONASE) 50 MCG/ACT nasal spray Place 1 spray into both nostrils daily as needed. For pollen exposure.    Marland Kitchen. omeprazole (PRILOSEC) 40 MG capsule Take 40 mg by mouth daily.      Allergies as of 05/30/2016  . (No Known Allergies)    History reviewed. No pertinent family history.  Social History   Social History  . Marital Status: Married    Spouse Name: N/A  . Number of Children: N/A  . Years of Education: N/A   Occupational History  . Not on file.   Social History Main Topics  . Smoking status: Never Smoker   . Smokeless tobacco: Not on file  .  Alcohol Use: Yes     Comment: occ  . Drug Use: No  . Sexual Activity: Not on file   Other Topics Concern  . Not on file   Social History Narrative    Review of Systems: As per HPI, all others negative  Physical Exam: Vital signs in last 24 hours: Temp:  [98 F (36.7 C)] 98 F (36.7 C) (06/10 0009) Pulse Rate:  [63-80] 76 (06/10 0802) Resp:  [14-20] 14 (06/10 0802) BP: (121-136)/(74-98) 121/89 mmHg (06/10 0757) SpO2:  [96 %-99 %] 99 % (06/10 0802) Weight:  [87.091 kg (192 lb)] 87.091 kg (192 lb) (06/10 0009)   General:   Alert,  Well-developed, well-nourished, pleasant and cooperative in NAD Head:  Normocephalic and atraumatic. Eyes:  Sclera clear, no icterus.   Conjunctiva pink. Ears:  Normal auditory acuity. Nose:  No deformity, discharge,  or lesions. Mouth:  No deformity or lesions.  Oropharynx pink & moist. Neck:  Supple; no masses or thyromegaly. Lungs:  Clear throughout to auscultation.   No wheezes, crackles, or rhonchi. No acute distress. Heart:  Regular rate and rhythm; no murmurs, clicks, rubs,  or gallops. Abdomen:  Soft, nontender and nondistended. No masses, hepatosplenomegaly or hernias noted. Normal bowel sounds, without guarding, and without rebound.     Msk:  Symmetrical without gross deformities. Normal posture. Pulses:  Normal pulses noted. Extremities:  Without clubbing or edema. Neurologic:  Alert  and  oriented x4;  grossly normal neurologically. Skin:  Intact without significant lesions or rashes. Psych:  Alert and cooperative. Normal mood and affect.   Lab Results:  Recent Labs  05/31/16 0205  WBC 9.0  HGB 15.3  HCT 41.3  PLT 238   BMET  Recent Labs  05/31/16 0205  NA 139  K 3.6  CL 108  CO2 23  GLUCOSE 110*  BUN 14  CREATININE 0.91  CALCIUM 9.5   LFT  Recent Labs  05/31/16 0205  PROT 7.5  ALBUMIN 4.7  AST 20  ALT 27  ALKPHOS 73  BILITOT 0.8   PT/INR No results for input(s): LABPROT, INR in the last 72  hours.  Studies/Results: No results found.  Impression:  1.  Possible esophageal foreign body (steak). 2.  GERD.  Plan:  1.  Endoscopy with possible esophageal foreign body removal. 2.  Risks (bleeding, infection, bowel perforation that could require surgery, sedation-related changes in cardiopulmonary systems), benefits (identification and possible treatment of source of symptoms, exclusion of certain causes of symptoms), and alternatives (watchful waiting, radiographic imaging studies, empiric medical treatment) of upper endoscopy (EGD) were explained to patient/family in detail and patient wishes to proceed.     Deshondra Worst M  05/31/2016, 8:06 AM  Pager [phone] If no answer or after 5 PM call [phone]  

## 2016-05-31 NOTE — ED Notes (Signed)
Bed: [id number] Expected date:  Expected time:  Means of arrival:  Comments: Tx from [facility]

## 2016-06-02 ENCOUNTER — Encounter (HOSPITAL_COMMUNITY): Payer: Self-pay | Admitting: Gastroenterology

## 2016-12-05 ENCOUNTER — Emergency Department (HOSPITAL_COMMUNITY)
Admission: EM | Admit: 2016-12-05 | Discharge: 2016-12-05 | Disposition: A | Discharge status: Home / Self Care | Payer: 59 | Attending: Emergency Medicine | Admitting: Emergency Medicine

## 2016-12-05 ENCOUNTER — Encounter (HOSPITAL_COMMUNITY): Payer: Self-pay

## 2016-12-05 DIAGNOSIS — Y999 Unspecified external cause status: Secondary | ICD-10-CM | POA: Diagnosis not present

## 2016-12-05 DIAGNOSIS — T18120A Food in esophagus causing compression of trachea, initial encounter: Secondary | ICD-10-CM | POA: Diagnosis present

## 2016-12-05 DIAGNOSIS — Y929 Unspecified place or not applicable: Secondary | ICD-10-CM | POA: Diagnosis not present

## 2016-12-05 DIAGNOSIS — K222 Esophageal obstruction: Secondary | ICD-10-CM

## 2016-12-05 DIAGNOSIS — T18128A Food in esophagus causing other injury, initial encounter: Secondary | ICD-10-CM

## 2016-12-05 DIAGNOSIS — X58XXXA Exposure to other specified factors, initial encounter: Secondary | ICD-10-CM | POA: Diagnosis not present

## 2016-12-05 DIAGNOSIS — Y939 Activity, unspecified: Secondary | ICD-10-CM | POA: Diagnosis not present

## 2016-12-05 LAB — I-STAT CHEM 8, ED
BUN: 9 mg/dL (ref 6–20)
CHLORIDE: 104 mmol/L (ref 101–111)
Calcium, Ion: 1.22 mmol/L (ref 1.15–1.40)
Creatinine, Ser: 1 mg/dL (ref 0.61–1.24)
Glucose, Bld: 122 mg/dL — ABNORMAL HIGH (ref 65–99)
HEMATOCRIT: 41 % (ref 39.0–52.0)
HEMOGLOBIN: 13.9 g/dL (ref 13.0–17.0)
POTASSIUM: 3.9 mmol/L (ref 3.5–5.1)
Sodium: 141 mmol/L (ref 135–145)
TCO2: 25 mmol/L (ref 0–100)

## 2016-12-05 MED ORDER — NITROGLYCERIN 0.4 MG SL SUBL
0.4000 mg | SUBLINGUAL_TABLET | Freq: Once | SUBLINGUAL | Status: AC
  Administered 2016-12-05: 0.4 mg via SUBLINGUAL
  Filled 2016-12-05: qty 1

## 2016-12-05 MED ORDER — GLUCAGON HCL RDNA (DIAGNOSTIC) 1 MG IJ SOLR
1.0000 mg | Freq: Once | INTRAMUSCULAR | Status: AC
  Administered 2016-12-05: 1 mg via INTRAVENOUS
  Filled 2016-12-05: qty 1

## 2016-12-05 MED ORDER — SODIUM CHLORIDE 0.9 % IV BOLUS (SEPSIS)
500.0000 mL | Freq: Once | INTRAVENOUS | Status: AC
  Administered 2016-12-05: 500 mL via INTRAVENOUS

## 2016-12-05 NOTE — ED Notes (Signed)
While starting IV and after giving NTG 0.4 SL, patient had a syncopal episode lasting approx 20 seconds. Patient confused when he awoke. EDP notified.

## 2016-12-05 NOTE — ED Provider Notes (Signed)
[facility]-EMERGENCY DEPT Provider Note   CSN: [id number] Arrival date & time: 12/05/16  [id number]     History   Chief Complaint Chief Complaint  Patient presents with  . food stuck in throat    HPI Christopher Buck is a 45 y.o. male.  HPI Patient presents to the emergency department with complaints of esophageal food impaction.  He's had an esophageal food impaction before.  This was approximately 6 months ago he required endoscopy at that time to push the food through.  He's had an esophageal dilation of his distal esophagus by Dr. Dulce Sellaroutlaw.  This morning he was eating a chicken biscuit and felt as though something got hung up in his distal esophagus.  He's been unable to tolerate fluids at home.  No other complaints at this time.  Symptoms are mild to moderate in severity.    Past Medical History:  Diagnosis Date  . GERD (gastroesophageal reflux disease)     Patient Active Problem List   Diagnosis Date Noted  . 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 ESOPHAGOGASTRODUODENOSCOPY N/A 05/31/2016   Procedure: ESOPHAGOGASTRODUODENOSCOPY (EGD);  Surgeon: Willis ModenaWilliam Outlaw, MD;  [facility];  Service: Endoscopy;  Laterality: N/A;       Home Medications    Prior to Admission medications   Medication Sig Start Date End Date Taking? Authorizing Provider  omeprazole (PRILOSEC) 40 MG capsule Take 40 mg by mouth daily.   Yes Historical Provider, MD    Family History History reviewed. No pertinent family history.  Social History Social History  Substance Use Topics  . Smoking status: Never Smoker  . Smokeless tobacco: Never Used  . Alcohol use Yes     Comment: occ     Allergies   Patient has no known allergies.   Review of Systems Review of Systems  All other systems reviewed and are negative.    Physical Exam Updated Vital Signs BP 116/63 (BP Location: Left Arm)   Pulse 72   Temp 97.6 F  (36.4 C) (Oral)   Resp 20   Ht 6' (1.829 m)   Wt 195 lb (88.5 kg)   SpO2 96%   BMI 26.45 kg/m   Physical Exam  Constitutional: He is oriented to person, place, and time. He appears well-developed and well-nourished.  HENT:  Head: Normocephalic.  Eyes: EOM are normal.  Neck: Normal range of motion.  Cardiovascular: Normal rate.   Pulmonary/Chest: Effort normal. No respiratory distress.  Abdominal: Soft. He exhibits no distension.  Musculoskeletal: Normal range of motion.  Neurological: He is alert and oriented to person, place, and time.  Psychiatric: He has a normal mood and affect.  Nursing note and vitals reviewed.    ED Treatments / Results  Labs (all labs ordered are listed, but only abnormal results are displayed) Labs Reviewed  I-STAT CHEM 8, ED - Abnormal; Notable for the following:       Result Value   Glucose, Bld 122 (*)    All other components within normal limits    EKG  EKG Interpretation None       Radiology No results found.  Procedures Procedures (including critical care time)  Medications Ordered in ED Medications  glucagon (human recombinant) (GLUCAGEN) injection 1 mg (1 mg Intravenous Given 12/05/16 0801)  sodium chloride 0.9 % bolus 500 mL (0 mLs Intravenous Stopped 12/05/16 0839)  nitroGLYCERIN (NITROSTAT) SL tablet 0.4 mg (0.4 mg Sublingual  Given 12/05/16 0751)     Initial Impression / Assessment and Plan / ED Course  I have reviewed the triage vital signs and the nursing notes.  Pertinent labs & imaging results that were available during my care of the patient were reviewed by me and considered in my medical decision making (see chart for details).  Clinical Course     Esophageal food impaction resolved with glucagon and nitroglycerin.  The patient feels much better at this time.  Is able to keep fluids down.  He will be discharged home at this time on a liquid diet.  Outpatient GI follow-up for possible repeat  endoscopy/esophageal dilation.  Patient understands return to the ER for new or worsening symptoms   Final Clinical Impressions(s) / ED Diagnoses   Final diagnoses:  Esophageal obstruction due to food impaction    New Prescriptions New Prescriptions   No medications on file     Azalia BilisKevin Aneudy Champlain, MD 12/05/16 [phone]

## 2016-12-05 NOTE — ED Notes (Signed)
Patient able to swallow water and states the food is dislodged.

## 2016-12-05 NOTE — ED Triage Notes (Addendum)
Patient reports eating a chicken biscuit this AM. Food stuck in throat. Patient states unable to swallow water. No breathing difficulties and is able to speak clearly.. Patient has had this before and had to have endo remove.
